# Patient Record
Sex: Male | Born: 1960
Health system: Southern US, Community
[De-identification: ages and names within clinical notes are randomized; demographics above are authoritative.]

## PROBLEM LIST (undated history)

## (undated) HISTORY — PX: TENDON RELEASE: SHX230

---

## 2008-09-22 ENCOUNTER — Ambulatory Visit: Payer: Self-pay | Admitting: Internal Medicine

## 2010-11-21 ENCOUNTER — Ambulatory Visit: Payer: Self-pay | Admitting: Urology

## 2010-11-27 ENCOUNTER — Ambulatory Visit: Payer: Self-pay | Admitting: Urology

## 2012-08-21 ENCOUNTER — Ambulatory Visit: Payer: Self-pay | Admitting: Family Medicine

## 2013-05-20 DIAGNOSIS — I10 Essential (primary) hypertension: Secondary | ICD-10-CM | POA: Insufficient documentation

## 2013-09-06 ENCOUNTER — Ambulatory Visit: Payer: Self-pay | Admitting: Gastroenterology

## 2014-05-22 DIAGNOSIS — K219 Gastro-esophageal reflux disease without esophagitis: Secondary | ICD-10-CM | POA: Insufficient documentation

## 2015-01-02 ENCOUNTER — Encounter: Payer: Self-pay | Admitting: Urology

## 2015-01-02 ENCOUNTER — Ambulatory Visit: Payer: Self-pay | Admitting: Urology

## 2018-01-11 ENCOUNTER — Encounter: Payer: Self-pay | Admitting: Urology

## 2018-01-11 ENCOUNTER — Ambulatory Visit (INDEPENDENT_AMBULATORY_CARE_PROVIDER_SITE_OTHER): Payer: BLUE CROSS/BLUE SHIELD | Admitting: Urology

## 2018-01-11 VITALS — BP 113/75 | HR 78 | Ht 70.0 in | Wt 155.4 lb

## 2018-01-11 DIAGNOSIS — E119 Type 2 diabetes mellitus without complications: Secondary | ICD-10-CM | POA: Insufficient documentation

## 2018-01-11 DIAGNOSIS — N486 Induration penis plastica: Secondary | ICD-10-CM

## 2018-01-11 DIAGNOSIS — E7800 Pure hypercholesterolemia, unspecified: Secondary | ICD-10-CM | POA: Insufficient documentation

## 2018-01-11 NOTE — Progress Notes (Signed)
Dorsal is over  01/11/2018  2:43 PM  Jerome Oliver 04/20/1960 [id number]  Referring provider: Myrene BuddyGauger, Sarah Kathryn, NP [address] [place], [place] [id number]  Chief Complaint  Patient presents with  . Abnormal Penile Curvature   HPI: Jerome Oliver is a 57 y.o. male that was referred by his PCP, Jerome BuddySarah Oliver Gauger NP, for patient concerns of Peyronies disease.  He first noticed changes in his penis about a year ago, and notes midshaft dorsal curvature estimated at 45 degrees. He reports the curvature has been stable since he first noticed it. Has not noted an hourglass deformity. He denies history of injury during intercourse, pain with erections. He notes that his libido is less than when he was younger but "nothing abnormal for his age". He denies dysuria, gross hematuria or flank/abdominal/pelvic/scrotal pain.  His last PSA (08/11/2017) was 0.76.  PMH: No past medical history on file.  Surgical History: Past Surgical History:  Procedure Laterality Date  . TENDON RELEASE     Home Medications:  Allergies as of 01/11/2018   No Known Allergies     Medication List        Accurate as of 01/11/18  2:43 PM. Always use your most recent med list.          losartan 50 MG tablet Commonly known as:  COZAAR   metFORMIN 1000 MG tablet Commonly known as:  GLUCOPHAGE Take by mouth.   pioglitazone 45 MG tablet Commonly known as:  ACTOS   pravastatin 40 MG tablet Commonly known as:  PRAVACHOL Take by mouth.   PRECISION QID TEST test strip Generic drug:  glucose blood Check Blood sugar 7 times a day   PRILOSEC OTC 20 MG tablet Generic drug:  omeprazole Take by mouth.       Allergies: No Known Allergies  Family History: No family history on file.  Social History:  reports that he quit smoking about 29 years ago. His smoking use included cigarettes. He has never used smokeless tobacco. He reports that he drank alcohol. He reports that he has current or past  drug history.  ROS: UROLOGY Frequent Urination?: No Hard to postpone urination?: No Burning/pain with urination?: No Get up at night to urinate?: No Leakage of urine?: No Urine stream starts and stops?: No Trouble starting stream?: No Do you have to strain to urinate?: No Blood in urine?: No Urinary tract infection?: No Sexually transmitted disease?: No Injury to kidneys or bladder?: No Painful intercourse?: No Weak stream?: No Erection problems?: Yes Penile pain?: No  Gastrointestinal Nausea?: No Vomiting?: No Indigestion/heartburn?: No Diarrhea?: No Constipation?: No  Constitutional Fever: No Night sweats?: No Weight loss?: No Fatigue?: No  Skin Skin rash/lesions?: No Itching?: No  Eyes Blurred vision?: No Double vision?: No  Ears/Nose/Throat Sore throat?: No Sinus problems?: No  Hematologic/Lymphatic Swollen glands?: No Easy bruising?: No  Cardiovascular Leg swelling?: No Chest pain?: No  Respiratory Cough?: No Shortness of breath?: No  Endocrine Excessive thirst?: No  Musculoskeletal Back pain?: Yes Joint pain?: Yes  Neurological Headaches?: No Dizziness?: No  Psychologic Depression?: No Anxiety?: No  Physical Exam: BP 113/75 (BP Location: Left Arm, Patient Position: Sitting, Cuff Size: Normal)   Pulse 78   Ht 5\' 10"  (1.778 m)   Wt 155 lb 6.4 oz (70.5 kg)   BMI 22.30 kg/m   Constitutional:  Alert and oriented, No acute distress. Respiratory: Normal respiratory effort, no increased work of breathing. GU: No CVA tenderness. Penis with an elongated  dorsal plaque.  Skin: No rashes, bruises or suspicious lesions. Neurologic: Grossly intact, no focal deficits, moving all 4 extremities. Psychiatric: Normal mood and affect.  Laboratory Data: Obtained through Media from [facility] (See Epic)  08/11/2017 8:32 Ref Range & Units  WBC 6.1 4.1 - 10.2 10^3/uL  HGB 13.1 14.1 - 18.1 gm/dL  HCT 40.9 81.1 - 91.4 %  MCV 86.9  80.0 - 100.0  PLT 213 150 - 45 10^3 uL  TESTOSTERONE    PSA 0.76 0.10-4.00 ng/mL  HGBA1C 8.3 4.2-5.6%  CREATININE 1.0 0.7 - 1.3 mg/dL    Assessment & Plan:    1. Peyronie's disease - Based on patient's history of physical exam, findings are consistent with Peyronie's disease. - Pathophysiology was discussed at length including the 2 phases of the diease, acute and chronic. - Treatment options and goals of treatment were discussed today in detail. Options including observation, penile plaque and graft, penile plication, placement of penile prosthesis, and injection of collagenase were all reviewed.  An benefits of each were discussed at length. - Collagenase injections with the medication Xiaflex were also discussed.  We discussed that this medication is administered in cycles which include 2 injections of the medication into the penile plaque followed by modeling procedure with 6 weeks of home exercises. Goals of treatment were reviewed which include improvement of penile curvature ideally to facilitate sexual function/penetration but likely not complete resolution of the curvature. Risks including failure of the medication, penile hematoma/edema, pain, and penile fracture were all discussed. All of his questions were answered. - He'll call us to let us know if it like to schedule cycle this medication and we will arrange for insurance investigation. He will need an induction of an erection with measurement as part of this evaluation.   Riki Altes, MD  [facility] [address] [place], [place] [address] [phone]  I, Robbi Garter , am acting as a scribe for [employer], MD  I, Riki Altes, MD, have reviewed all documentation for this visit. The documentation on 01/11/18 for the exam, diagnosis, procedures, and orders are all accurate and complete.

## 2018-03-05 ENCOUNTER — Ambulatory Visit (INDEPENDENT_AMBULATORY_CARE_PROVIDER_SITE_OTHER): Payer: BLUE CROSS/BLUE SHIELD | Admitting: Urology

## 2018-03-05 ENCOUNTER — Encounter: Payer: Self-pay | Admitting: Urology

## 2018-03-05 VITALS — BP 125/74 | HR 57 | Ht 70.0 in | Wt 150.0 lb

## 2018-03-05 DIAGNOSIS — N400 Enlarged prostate without lower urinary tract symptoms: Secondary | ICD-10-CM | POA: Diagnosis not present

## 2018-03-05 NOTE — Progress Notes (Signed)
03/05/2018  11:31 AM   [name] 59/13/1962 [id number]  Referring provider: Myrene Buddy, NP [address] [place], [place] [address]  Chief Complaint  Patient presents with  . Abnormal Penile Curvature    prostate check - patient request   Urologic History 1. Peyronie's Disease  - Noticed changes late 2018  - Midshaft dorsal curvature, estimated 45, no hourglass deformity  - Previous discussion of treatment options (observation, penile plaque and graft, penile plication, placement of penile prosthesis, and injection of collagenase) and goals  2. BPH without urinary symptoms  - Patient is asymptomatic   HPI: Jerome Oliver is a 58 y.o. White or Caucasian male that presents for a prostate check per his request. Patient has a history of Peyronie's disease.  -Cousin recently diagnosed with prostate cancer and scheduled for radical prostatectomy.  He came in specifically requesting a DRE.  - PSA 08/2017 0.76  - Denies dysuria, gross hematuria or flank/abdominal/pelvic/scrotal pain.  - Reports last DRE was about 18 years ago  PMH: No past medical history on file.  Surgical History: Past Surgical History:  Procedure Laterality Date  . TENDON RELEASE     Home Medications:  Allergies as of 03/05/2018   No Known Allergies     Medication List       Accurate as of March 05, 2018 11:31 AM. Always use your most recent med list.        losartan 50 MG tablet Commonly known as:  COZAAR   metFORMIN 1000 MG tablet Commonly known as:  GLUCOPHAGE Take by mouth.   pioglitazone 45 MG tablet Commonly known as:  ACTOS   pravastatin 40 MG tablet Commonly known as:  PRAVACHOL Take by mouth.   PRECISION QID TEST test strip Generic drug:  glucose blood Check Blood sugar 7 times a day   PRILOSEC OTC 20 MG tablet Generic drug:  omeprazole Take by mouth.      Allergies: No Known Allergies  Family History: No family history on file.  Social History:   reports that he quit smoking about 30 years ago. His smoking use included cigarettes. He has never used smokeless tobacco. He reports previous alcohol use. He reports previous drug use.  ROS: UROLOGY Frequent Urination?: No Hard to postpone urination?: No Burning/pain with urination?: No Get up at night to urinate?: No Leakage of urine?: No Urine stream starts and stops?: No Trouble starting stream?: No Do you have to strain to urinate?: No Blood in urine?: No Urinary tract infection?: No Sexually transmitted disease?: No Injury to kidneys or bladder?: No Painful intercourse?: No Weak stream?: No Erection problems?: No Penile pain?: No  Gastrointestinal Nausea?: No Vomiting?: No Indigestion/heartburn?: No Diarrhea?: No Constipation?: No  Constitutional Fever: No Night sweats?: No Weight loss?: No Fatigue?: No  Skin Skin rash/lesions?: No Itching?: No  Eyes Blurred vision?: No Double vision?: No  Ears/Nose/Throat Sore throat?: No Sinus problems?: No  Hematologic/Lymphatic Swollen glands?: No Easy bruising?: No  Cardiovascular Leg swelling?: No Chest pain?: No  Respiratory Cough?: No Shortness of breath?: No  Endocrine Excessive thirst?: No  Musculoskeletal Back pain?: No Joint pain?: No  Neurological Headaches?: No Dizziness?: No  Psychologic Depression?: No Anxiety?: No  Physical Exam: BP 125/74 (BP Location: Left Arm, Patient Position: Sitting)   Pulse (!) 57   Ht 5\' 10"  (1.778 m)   Wt 150 lb (68 kg)   BMI 21.52 kg/m   Constitutional:  Alert and oriented, No acute distress. Respiratory: Normal  respiratory effort, no increased work of breathing. Head: Normocephalic and Atraumatic. Rectal: Normal sphincter tone. Enlarged Prostate approximately 50 grams, smooth, no nodules. External hemorrhoids. GU: No CVA tenderness Skin: No rashes, bruises or suspicious lesions. Neurologic: Grossly intact, no focal deficits, moving all 4  extremities. Psychiatric: Normal mood and affect.  Assessment & Plan:    1. BPH without urinary symptoms  - DRE revealed enlarged prostate  - Last PSA was 0.76 (08/11/2017)  - Patient is asymptomatic  2. Peyronie's Disease  -He does not desire treatment at this time  Return if symptoms worsen or fail to improve.  Riki Altes, MD [facility] [address] [place], [place] [address] [phone]  I, [phone] Melany Guernsey , am acting as a scribe for Riki Altes, MD  I, Riki Altes, MD, have reviewed all documentation for this visit. The documentation on 03/05/18 for the exam, diagnosis, procedures, and orders are all accurate and complete.

## 2019-05-19 ENCOUNTER — Other Ambulatory Visit: Payer: Self-pay | Admitting: Physical Medicine & Rehabilitation

## 2019-05-19 ENCOUNTER — Ambulatory Visit: Payer: 59 | Attending: Internal Medicine

## 2019-05-19 DIAGNOSIS — M5416 Radiculopathy, lumbar region: Secondary | ICD-10-CM

## 2019-05-19 DIAGNOSIS — Z23 Encounter for immunization: Secondary | ICD-10-CM

## 2019-05-19 NOTE — Progress Notes (Signed)
   Covid-19 Vaccination Clinic  Name:  Jerome Oliver    MRN: [id number] DOB: 12-11-1960  05/19/2019  [name] was observed post Covid-19 immunization for 15 minutes without incident. He was provided with Vaccine Information Sheet and instruction to access the V-Safe system.   [name] was instructed to call 911 with any severe reactions post vaccine: Marland Kitchen Difficulty breathing  . Swelling of face and throat  . A fast heartbeat  . A bad rash all over body  . Dizziness and weakness   Immunizations Administered    Name Date Dose VIS Date Route   Pfizer COVID-19 Vaccine 05/19/2019  8:16 AM 0.3 mL 01/21/2019 Intramuscular   Manufacturer: [employer], [employer]   Lot: [id number]   NDC: [address]-9

## 2019-05-30 ENCOUNTER — Ambulatory Visit: Payer: 59

## 2019-06-13 ENCOUNTER — Other Ambulatory Visit: Payer: Self-pay

## 2019-06-13 ENCOUNTER — Ambulatory Visit
Admission: RE | Admit: 2019-06-13 | Discharge: 2019-06-13 | Disposition: A | Discharge status: Home / Self Care | Payer: 59 | Source: Ambulatory Visit | Attending: Physical Medicine & Rehabilitation | Admitting: Physical Medicine & Rehabilitation

## 2019-06-13 DIAGNOSIS — M5416 Radiculopathy, lumbar region: Secondary | ICD-10-CM | POA: Diagnosis present

## 2019-06-14 ENCOUNTER — Ambulatory Visit: Payer: 59 | Attending: Internal Medicine

## 2019-06-14 DIAGNOSIS — Z23 Encounter for immunization: Secondary | ICD-10-CM

## 2019-06-14 NOTE — Progress Notes (Signed)
   Covid-19 Vaccination Clinic  Name:  Jerome Oliver    MRN: [id number] DOB: [birth year]  06/14/2019  [name] was observed post Covid-19 immunization for 15 minutes without incident. He was provided with Vaccine Information Sheet and instruction to access the V-Safe system.   [name] was instructed to call 911 with any severe reactions post vaccine: Marland Kitchen Difficulty breathing  . Swelling of face and throat  . A fast heartbeat  . A bad rash all over body  . Dizziness and weakness   Immunizations Administered    Name Date Dose VIS Date Route   Pfizer COVID-19 Vaccine 06/14/2019  8:41 AM 0.3 mL 04/06/2018 Intramuscular   Manufacturer: [employer], [employer]   Lot: [id number]   NDC: [address]-4

## 2020-04-25 ENCOUNTER — Ambulatory Visit: Payer: 59 | Admitting: Urology

## 2020-04-25 ENCOUNTER — Other Ambulatory Visit: Payer: Self-pay

## 2020-04-25 ENCOUNTER — Encounter: Payer: Self-pay | Admitting: Urology

## 2020-04-25 VITALS — BP 97/61 | HR 52 | Ht 70.0 in | Wt 140.0 lb

## 2020-04-25 DIAGNOSIS — Z125 Encounter for screening for malignant neoplasm of prostate: Secondary | ICD-10-CM | POA: Diagnosis not present

## 2020-04-25 DIAGNOSIS — N401 Enlarged prostate with lower urinary tract symptoms: Secondary | ICD-10-CM

## 2020-04-25 MED ORDER — TAMSULOSIN HCL 0.4 MG PO CAPS
0.4000 mg | ORAL_CAPSULE | Freq: Every day | ORAL | 1 refills | Status: DC
Start: 2020-04-25 — End: 2020-06-29

## 2020-04-25 NOTE — Progress Notes (Signed)
   04/25/2020 8:29 AM   [name] 11-Jul-1960 [id number]  Referring provider: Myrene Buddy, NP [address] [place],  [place] [address]  Chief Complaint  Patient presents with  . Benign Prostatic Hypertrophy    Urologic History  1. Peyronie's Disease - Noticed changes late 2018 - Midshaft dorsal curvature, estimated 45, no hourglass   deformity - Previous discussion of treatment options  2. BPH without urinary symptoms               HPI: 60 y.o. male presents for follow-up.   Last seen January 2020  Asymptomatic; PSA 0.76 July 2019  Over the last 12 months has noted bothersome lower urinary tract symptoms including urinary frequency, nocturia every 2 hours; stream variable  IPSS 18/35  Denies dysuria, gross hematuria  No flank, abdominal or pelvic pain     PMH: History reviewed. No pertinent past medical history.  Surgical History: Past Surgical History:  Procedure Laterality Date  . TENDON RELEASE      Home Medications:  Allergies as of 04/25/2020   No Known Allergies     Medication List       Accurate as of April 25, 2020  8:29 AM. If you have any questions, ask your nurse or doctor.        losartan 50 MG tablet Commonly known as: COZAAR   metFORMIN 1000 MG tablet Commonly known as: GLUCOPHAGE Take by mouth.   pioglitazone 45 MG tablet Commonly known as: ACTOS   pravastatin 40 MG tablet Commonly known as: PRAVACHOL Take by mouth.   Precision QID Test test strip Generic drug: glucose blood Check Blood sugar 7 times a day   PriLOSEC OTC 20 MG tablet Generic drug: omeprazole Take by mouth.       Allergies: No Known Allergies  Family History: History reviewed. No pertinent family history.  Social History:  reports that he quit smoking about 32 years ago. His smoking use included cigarettes. He has never used smokeless tobacco. He reports previous alcohol use. He reports previous drug use.   Physical Exam: BP  97/61   Pulse (!) 52   Ht 5\' 10"  (1.778 m)   Wt 140 lb (63.5 kg)   BMI 20.09 kg/m   Constitutional:  Alert and oriented, No acute distress. HEENT: [place] AT, moist mucus membranes.  Trachea midline, no masses. Cardiovascular: No clubbing, cyanosis, or edema. Respiratory: Normal respiratory effort, no increased work of breathing. GU: Prostate 50 g, smooth without nodules Skin: No rashes, bruises or suspicious lesions. Neurologic: Grossly intact, no focal deficits, moving all 4 extremities. Psychiatric: Normal mood and affect.   Assessment & Plan:    1.  BPH with LUTS  No bothersome LUTS 2 years ago and symptoms presently bothersome enough that he desires a trial of medical management  Trial tamsulosin 0.4 mg daily  Instructed call back if medication not effective   2.  Prostate cancer screening  The pros and cons of prostate cancer screening were discussed and recommendations of screening in men between the ages of 64-69.  He desired to have his PSA checked today  1 year f/u scheduled   51-72, MD  [facility] [address], [address] [place], [place] [phone]

## 2020-04-26 LAB — PSA: Prostate Specific Ag, Serum: 1.2 ng/mL (ref 0.0–4.0)

## 2020-04-30 ENCOUNTER — Telehealth: Payer: Self-pay | Admitting: *Deleted

## 2020-04-30 NOTE — Telephone Encounter (Signed)
-----   Message from Riki Altes, MD sent at 04/27/2020  7:44 PM EDT ----- PSA normal and stable at 1.2

## 2020-04-30 NOTE — Telephone Encounter (Signed)
Notified patient as instructed, patient pleased. Discussed follow-up appointments, patient agrees  

## 2020-05-19 ENCOUNTER — Other Ambulatory Visit: Payer: Self-pay | Admitting: Urology

## 2020-06-29 ENCOUNTER — Other Ambulatory Visit: Payer: Self-pay

## 2020-06-29 MED ORDER — TAMSULOSIN HCL 0.4 MG PO CAPS: 0.4000 mg | ORAL_CAPSULE | Freq: Every day | ORAL | 2 refills | Status: DC

## 2020-06-29 NOTE — Telephone Encounter (Signed)
Patient called stating flomax is working well for him and he would like a refill. Per [facility] last note refill was sent , 90 day per patient request

## 2021-04-04 ENCOUNTER — Other Ambulatory Visit: Payer: Self-pay | Admitting: *Deleted

## 2021-04-04 MED ORDER — TAMSULOSIN HCL 0.4 MG PO CAPS: 0.4000 mg | ORAL_CAPSULE | Freq: Every day | ORAL | 2 refills | Status: AC

## 2021-04-25 ENCOUNTER — Ambulatory Visit: Payer: Self-pay | Admitting: Urology

## 2021-10-23 IMAGING — MR MR LUMBAR SPINE W/O CM
5 series · 30 of 48 positions shown · non-contrast
Comparison: 08/21/2012

CLINICAL DATA: Low back pain and right lower extremity pain and
numbness.

EXAM:
MRI LUMBAR SPINE WITHOUT CONTRAST
TECHNIQUE: Multiplanar, multisequence MR imaging of the lumbar spine was
performed. No intravenous contrast was administered.

[Series 5: T2 · sagittal · 4.0mm · 0.81mm/px · 6 of 17 slices shown (1 of 2)]
[im 1/17]
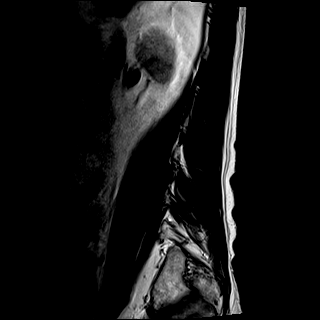
[im 4/17]
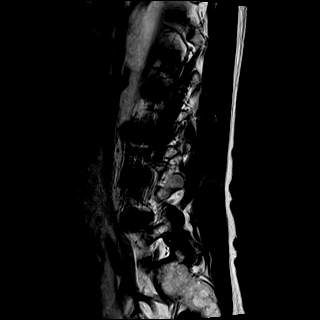
[im 7/17]
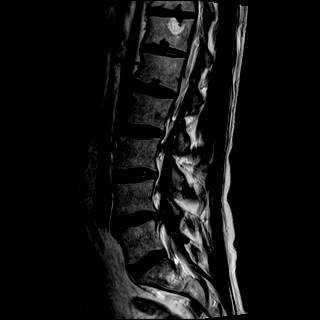
[im 10/17]
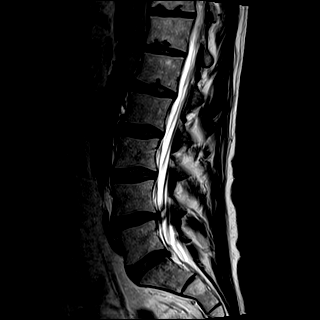
[im 13/17]
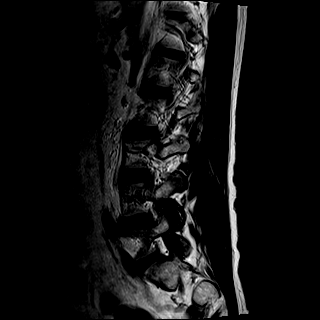
[im 17/17]
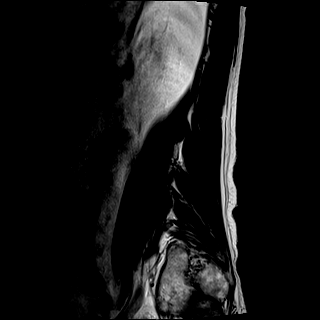

[Series 6: T1 · sagittal · 4.0mm · 0.81mm/px · 7 of 17 slices shown (1 of 2)]
[im 1/17]
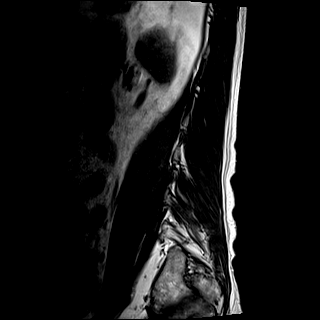
[im 3/17]
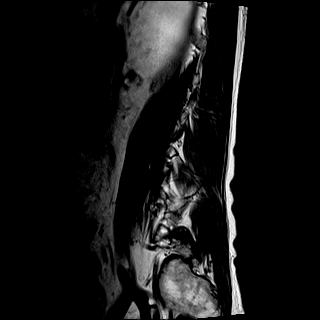
[im 6/17]
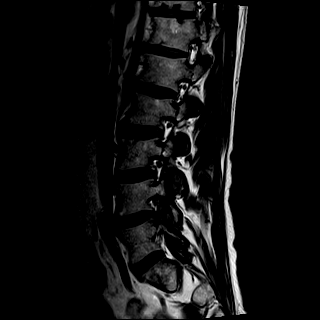
[im 9/17]
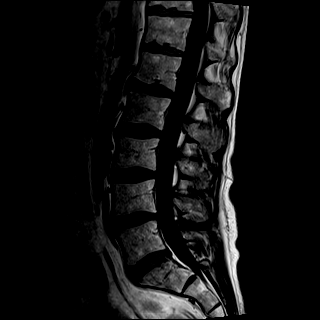
[im 11/17]
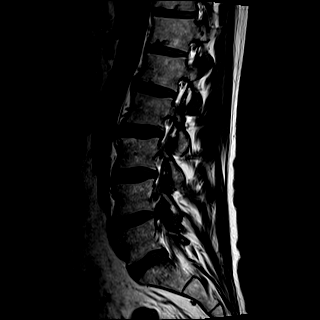
[im 14/17]
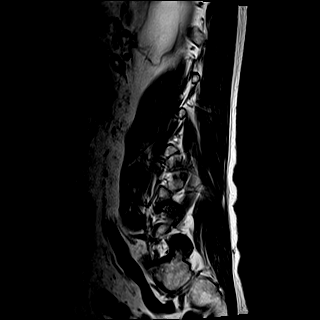
[im 17/17]
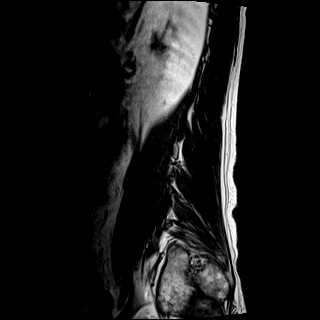

[Series 7: STIR · sagittal · 4.0mm · 0.41mm/px · 1 of 17 slices shown]
[im 1/17]
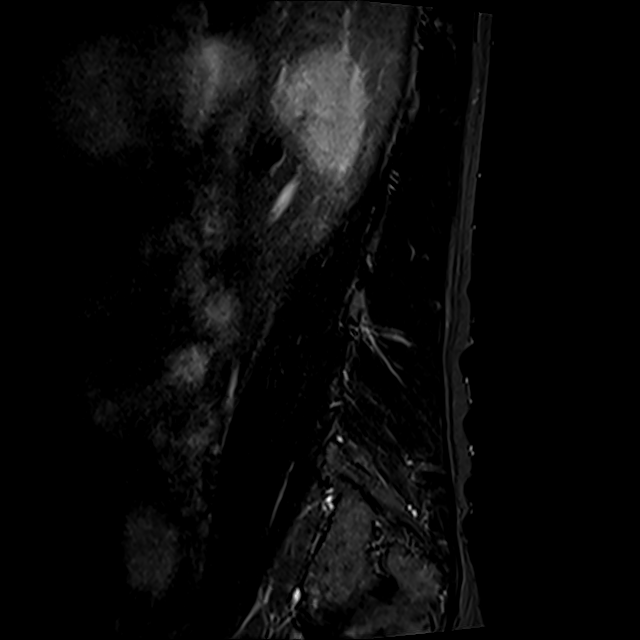

[Series 8: T2 · axial · 4.0mm · 0.78mm/px · z∈[-51,+161]mm · 8 of 36 slices shown (2 of 2)]
[im 1/36]
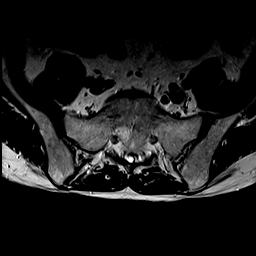
[im 6/36]
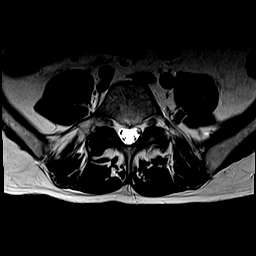
[im 11/36]
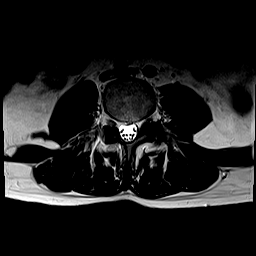
[im 17/36]
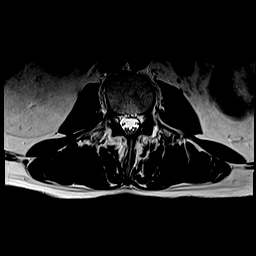
[im 19/36]
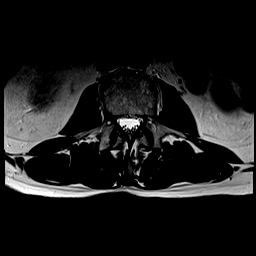
[im 25/36]
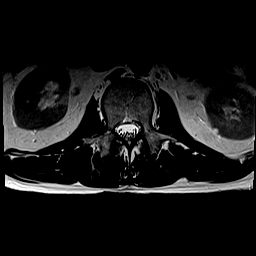
[im 30/36]
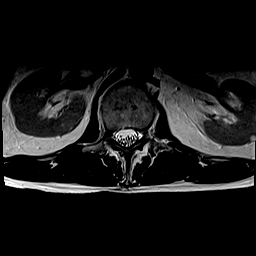
[im 36/36]
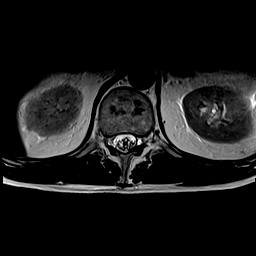

[Series 9: T1 · axial · 4.0mm · 0.39mm/px · z∈[-51,+161]mm · 8 of 36 slices shown (2 of 2)]
[im 1/36]
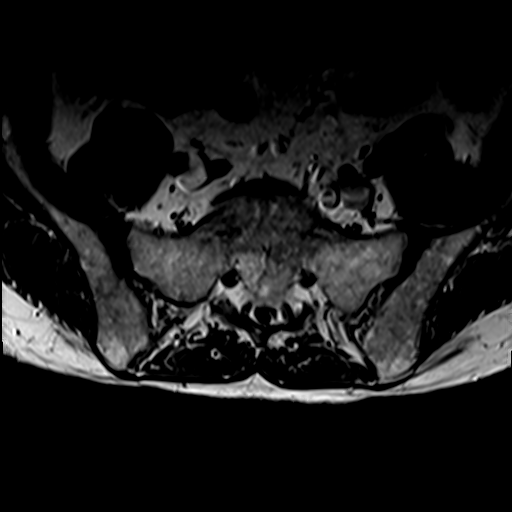
[im 6/36]
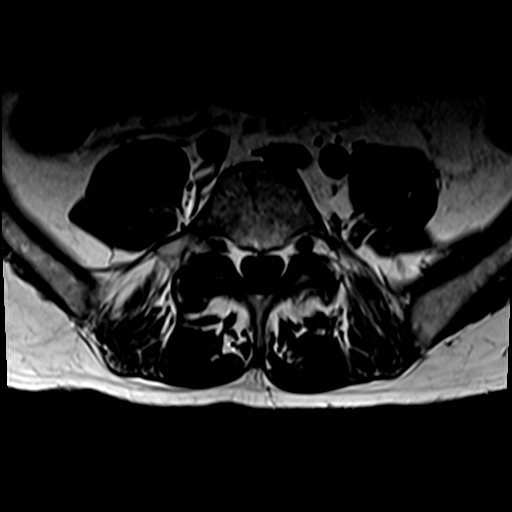
[im 11/36]
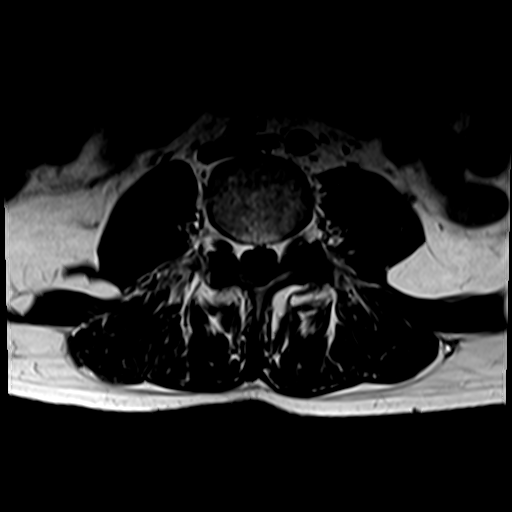
[im 17/36]
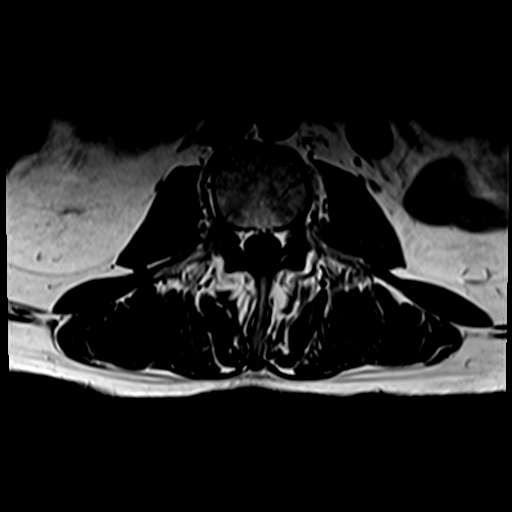
[im 19/36]
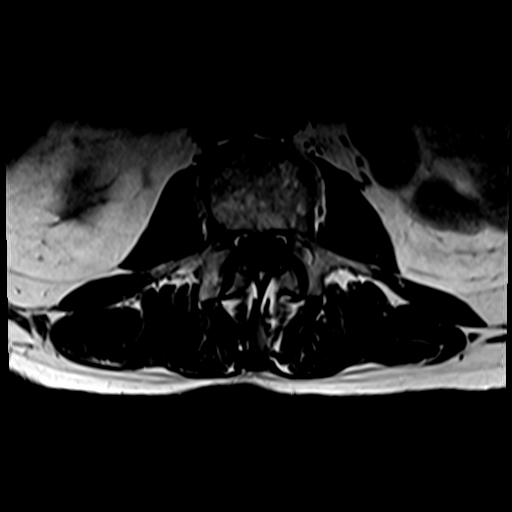
[im 25/36]
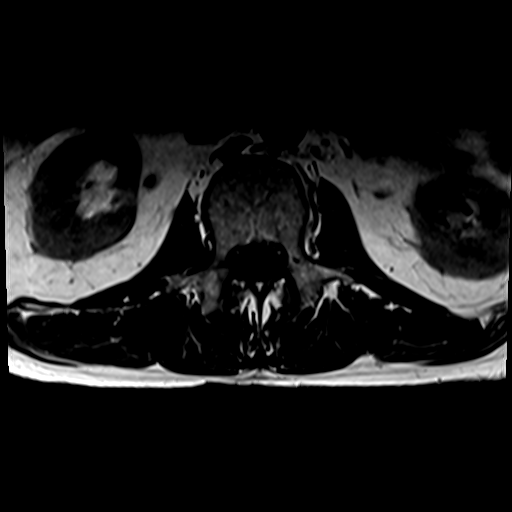
[im 30/36]
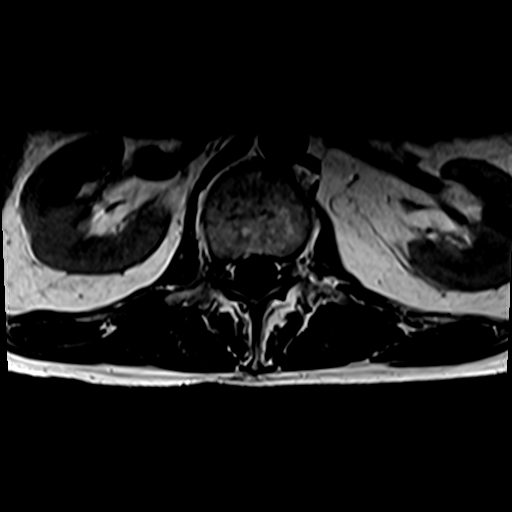
[im 36/36]
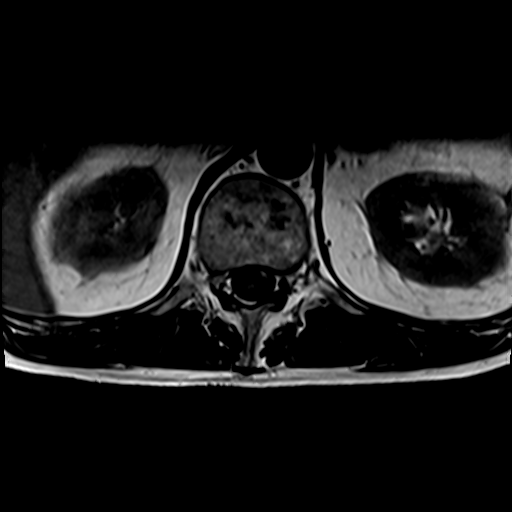

[30 of 48 positions shown; findings below may reference images not displayed]

FINDINGS: Segmentation: There are five lumbar type vertebral bodies. The last
full intervertebral disc space is labeled L5-S1. This correlates
with the prior study.

Alignment:  Normal

Vertebrae: Normal marrow signal except for a few scattered
hemangiomas. No worrisome bone lesions or fractures.

Conus medullaris and cauda equina: Conus extends to the T12-L1
level. Conus and cauda equina appear normal.

Paraspinal and other soft tissues: No significant paraspinal or
retroperitoneal findings.

Disc levels:

T12-L1: No significant findings.

L1-2: No significant findings. Mild facet disease, left greater than
right.

L2-3: Mild facet disease, left greater than right. No disc
protrusions, spinal or foraminal stenosis.

L3-4: Mild osteophytic ridging and mild to moderate facet disease
but no disc protrusions, significant spinal or foraminal stenosis.

L4-5: Shallow central and slightly right paracentral disc protrusion
in combination with a bulging annulus, osteophytic ridging and facet
disease contributes to mild spinal stenosis and mild to moderate
bilateral lateral recess stenosis. Findings are slightly progressive
when compared to the prior study. No foraminal stenosis.

L5-S1: Annular fissure and shallow central disc protrusion is
stable. No direct neural compression. No spinal or foraminal
stenosis. Moderate facet disease, right greater than left.
IMPRESSION: 1. Shallow central and slightly right paracentral disc protrusion at
L4-5 in combination with a bulging annulus, osteophytic ridging and
facet disease contributing to mild spinal stenosis and mild to
moderate bilateral lateral recess stenosis. Findings are slightly
progressive when compared to the prior study.
2. Stable annular fissure and shallow central disc protrusion at
L5-S1 but no direct neural compression.
